# Patient Record
Sex: Male | Born: 1935 | Race: White | Hispanic: No
Health system: Midwestern US, Academic
[De-identification: ages and names within clinical notes are randomized; demographics above are authoritative.]

---

## 2017-04-25 ENCOUNTER — Encounter: Admit: 2017-04-25 | Discharge: 2017-04-25 | Payer: MEDICARE

## 2017-04-29 ENCOUNTER — Encounter: Admit: 2017-04-29 | Discharge: 2017-04-29 | Payer: MEDICARE

## 2017-07-01 ENCOUNTER — Encounter: Admit: 2017-07-01 | Discharge: 2017-07-01 | Payer: MEDICARE

## 2017-07-01 NOTE — Progress Notes
Records sent to [facility] for long term storage.  Box Number [id number]

## 2018-02-06 ENCOUNTER — Encounter: Admit: 2018-02-06 | Discharge: 2018-02-06 | Payer: MEDICARE

## 2018-11-18 ENCOUNTER — Encounter: Admit: 2018-11-18 | Discharge: 2018-11-18 | Payer: MEDICARE

## 2018-11-18 ENCOUNTER — Encounter: Admit: 2018-11-18 | Discharge: 2018-11-19 | Payer: MEDICARE

## 2018-11-18 DIAGNOSIS — S06359A Traumatic hemorrhage of left cerebrum with loss of consciousness of unspecified duration, initial encounter: Secondary | ICD-10-CM

## 2018-11-18 MED ORDER — CARVEDILOL 6.25 MG PO TAB
6.25 mg | Freq: Two times a day (BID) | ORAL | 0 refills | Status: DC
Start: 2018-11-18 — End: 2018-11-19
  Administered 2018-11-19 (×2): 6.25 mg via ORAL

## 2018-11-18 MED ORDER — LISINOPRIL 20 MG PO TAB
20 mg | Freq: Every day | ORAL | 0 refills | Status: DC
Start: 2018-11-18 — End: 2018-11-19
  Administered 2018-11-19: 14:00:00 20 mg via ORAL

## 2018-11-18 MED ORDER — ACETAMINOPHEN 325 MG PO TAB
650 mg | ORAL | 0 refills | Status: DC
Start: 2018-11-18 — End: 2018-11-19
  Administered 2018-11-19 (×2): 650 mg via ORAL

## 2018-11-18 MED ORDER — AMLODIPINE 5 MG PO TAB
5 mg | Freq: Every day | ORAL | 0 refills | Status: DC
Start: 2018-11-18 — End: 2018-11-19
  Administered 2018-11-19: 14:00:00 5 mg via ORAL

## 2018-11-18 MED ORDER — ACETAMINOPHEN 160 MG/5 ML PO SOLN
650 mg | NASOGASTRIC | 0 refills | Status: DC
Start: 2018-11-18 — End: 2018-11-19

## 2018-11-18 MED ORDER — LEVOTHYROXINE 50 MCG PO TAB
50 ug | Freq: Every day | ORAL | 0 refills | Status: DC
Start: 2018-11-18 — End: 2018-11-19
  Administered 2018-11-19: 14:00:00 50 ug via ORAL

## 2018-11-18 MED ORDER — SIMVASTATIN 20 MG PO TAB
20 mg | Freq: Every evening | ORAL | 0 refills | Status: DC
Start: 2018-11-18 — End: 2018-11-19
  Administered 2018-11-19: 03:00:00 20 mg via ORAL

## 2018-11-18 MED ORDER — ISOSORBIDE MONONITRATE 30 MG PO TB24
30 mg | Freq: Every day | ORAL | 0 refills | Status: DC
Start: 2018-11-18 — End: 2018-11-19
  Administered 2018-11-19: 14:00:00 30 mg via ORAL

## 2018-11-18 MED ORDER — ONDANSETRON HCL (PF) 4 MG/2 ML IJ SOLN
4-8 mg | INTRAVENOUS | 0 refills | Status: DC | PRN
Start: 2018-11-18 — End: 2018-11-19

## 2018-11-18 MED ORDER — MYCOPHENOLATE MOFETIL 500 MG PO TAB
500 mg | Freq: Two times a day (BID) | ORAL | 0 refills | Status: DC
Start: 2018-11-18 — End: 2018-11-19
  Administered 2018-11-19 (×2): 500 mg via ORAL

## 2018-11-18 MED ORDER — LEVETIRACETAM 500 MG PO TAB
500 mg | Freq: Two times a day (BID) | ORAL | 0 refills | Status: DC
Start: 2018-11-18 — End: 2018-11-19

## 2018-11-18 MED ORDER — MAGNESIUM SULFATE IN WATER 4 GRAM/50 ML (8 %) IV PGBK
4 g | Freq: Once | INTRAVENOUS | 0 refills | Status: CP
Start: 2018-11-18 — End: ?
  Administered 2018-11-19: 03:00:00 4 g via INTRAVENOUS

## 2018-11-18 MED ORDER — PANTOPRAZOLE 40 MG PO TBEC
40 mg | Freq: Every day | ORAL | 0 refills | Status: DC
Start: 2018-11-18 — End: 2018-11-19
  Administered 2018-11-19: 03:00:00 40 mg via ORAL

## 2018-11-18 MED ORDER — FENTANYL CITRATE (PF) 50 MCG/ML IJ SOLN
25 ug | INTRAVENOUS | 0 refills | Status: DC | PRN
Start: 2018-11-18 — End: 2018-11-19

## 2018-11-18 MED ORDER — DOCUSATE SODIUM 100 MG PO CAP
100 mg | Freq: Two times a day (BID) | ORAL | 0 refills | Status: DC
Start: 2018-11-18 — End: 2018-11-19
  Administered 2018-11-19: 03:00:00 100 mg via ORAL

## 2018-11-18 MED ORDER — POLYETHYLENE GLYCOL 3350 17 GRAM PO PWPK
1 | Freq: Every day | ORAL | 0 refills | Status: DC
Start: 2018-11-18 — End: 2018-11-19

## 2018-11-18 MED ORDER — OXYCODONE 5 MG PO TAB
5 mg | ORAL | 0 refills | Status: DC | PRN
Start: 2018-11-18 — End: 2018-11-19

## 2018-11-18 MED ORDER — LABETALOL 5 MG/ML IV SYRG
10 mg | INTRAVENOUS | 0 refills | Status: DC | PRN
Start: 2018-11-18 — End: 2018-11-19

## 2018-11-19 ENCOUNTER — Encounter: Admit: 2018-11-19 | Discharge: 2018-11-19 | Payer: MEDICARE

## 2018-11-19 ENCOUNTER — Inpatient Hospital Stay: Admit: 2018-11-19 | Discharge: 2018-11-19 | Payer: MEDICARE

## 2018-11-19 ENCOUNTER — Inpatient Hospital Stay
Admit: 2018-11-19 | Discharge: 2018-11-19 | Disposition: A | Discharge status: Home / Self Care | Payer: MEDICARE | Source: Other Acute Inpatient Hospital

## 2018-11-19 DIAGNOSIS — G8911 Acute pain due to trauma: ICD-10-CM

## 2018-11-19 DIAGNOSIS — S06359D Traumatic hemorrhage of left cerebrum with loss of consciousness of unspecified duration, subsequent encounter: Principal | ICD-10-CM

## 2018-11-19 DIAGNOSIS — Z7982 Long term (current) use of aspirin: ICD-10-CM

## 2018-11-19 DIAGNOSIS — D649 Anemia, unspecified: ICD-10-CM

## 2018-11-19 DIAGNOSIS — I4891 Unspecified atrial fibrillation: ICD-10-CM

## 2018-11-19 DIAGNOSIS — R402413 Glasgow coma scale score 13-15, at hospital admission: ICD-10-CM

## 2018-11-19 DIAGNOSIS — E785 Hyperlipidemia, unspecified: ICD-10-CM

## 2018-11-19 DIAGNOSIS — I120 Hypertensive chronic kidney disease with stage 5 chronic kidney disease or end stage renal disease: ICD-10-CM

## 2018-11-19 DIAGNOSIS — Z940 Kidney transplant status: ICD-10-CM

## 2018-11-19 DIAGNOSIS — M19012 Primary osteoarthritis, left shoulder: ICD-10-CM

## 2018-11-19 DIAGNOSIS — M25562 Pain in left knee: ICD-10-CM

## 2018-11-19 DIAGNOSIS — Z66 Do not resuscitate: ICD-10-CM

## 2018-11-19 DIAGNOSIS — S066X9A Traumatic subarachnoid hemorrhage with loss of consciousness of unspecified duration, initial encounter: Principal | ICD-10-CM

## 2018-11-19 DIAGNOSIS — I2510 Atherosclerotic heart disease of native coronary artery without angina pectoris: ICD-10-CM

## 2018-11-19 DIAGNOSIS — Z7902 Long term (current) use of antithrombotics/antiplatelets: ICD-10-CM

## 2018-11-19 DIAGNOSIS — S06359A Traumatic hemorrhage of left cerebrum with loss of consciousness of unspecified duration, initial encounter: ICD-10-CM

## 2018-11-19 DIAGNOSIS — E039 Hypothyroidism, unspecified: ICD-10-CM

## 2018-11-19 LAB — BASIC METABOLIC PANEL
Lab: 1.1 mg/dL (ref 0.4–1.24)
Lab: 108 mg/dL — ABNORMAL HIGH (ref 70–100)
Lab: 18 mg/dL (ref 7–25)
Lab: 23 MMOL/L (ref 21–30)
Lab: 57 mL/min — ABNORMAL LOW (ref >60)
Lab: >60 mL/min (ref >60)
Lab: 140 MMOL/L (ref 137–147)
Lab: 24 MMOL/L (ref 21–30)
Lab: 10 (ref 3–12)

## 2018-11-19 LAB — CBC
Lab: 9.7 10*3/uL (ref 4.5–11.0)
Lab: 7 10*3/uL (ref >60)

## 2018-11-19 LAB — TACROLIMUS IMMUNOASSAY (FK506): Lab: 8.2 ng/mL (ref 2–15)

## 2018-11-19 LAB — PHOSPHORUS: Lab: 3.4 mg/dL (ref 2.0–4.5)

## 2018-11-19 MED ORDER — TACROLIMUS 1 MG PO CAP
1 mg | Freq: Two times a day (BID) | ORAL | 0 refills | Status: DC
Start: 2018-11-19 — End: 2018-11-19

## 2018-11-19 MED ORDER — TACROLIMUS 1 MG PO CAP
1 mg | Freq: Two times a day (BID) | ORAL | 0 refills | Status: DC
Start: 2018-11-19 — End: 2018-11-19
  Administered 2018-11-19: 19:00:00 1 mg via ORAL

## 2018-11-19 NOTE — Progress Notes
OCCUPATIONAL THERAPY  ASSESSMENT/DISCHARGE NOTE      Name: Brad Short        MRN: [id number]          DOB: [birth year]          Age: 85 y.o.  Admission Date: 11/18/2018             LOS: 1 day    Mobility  Patient Turn/Position: Chair  Progressive Mobility Level: Walk laps  Distance Walked (feet): 300 ft  Level of Assistance: Assist X1  Assistive Device: Walker  Time Tolerated: 11-30 minutes  Activity Limited By: No limitations    Subjective  Pertinent Dx per Physician: 85 y.o. male with fall yesterday, LOC, neurologically intact on exam, with CT head demonstrating a 1.3 cm left frontal IPH  Precautions: Falls  Pain / Complaints: Patient agrees to participate in therapy  Pain Location: Left;Shoulder  Pain Level Current: (mild)    Objective  Psychosocial Status: Willing and Cooperative to Participate  Persons Present: Physical Therapist    Home Living  Type of Home: House  Home Layout: One Level  Financial risk analyst / Tub: Pension scheme manager: Standard  Bathroom Equipment: Engineer, materials in Air traffic controller;Shower Chair;Grab Bars Around [employer] Equipment: Environmental consultant    Prior Function  Level Of Independence: Independent with ADLs and functional transfers;Independent with homemaking w/ ambulation  Lives With: Alone  Other Function Comments: No other falls reported. Son able to provide intermittent assist.    Vision  Current Vision: Wears Glasses All of the Time  Comment: Pt reports needing to get new glasses. No vision changes since fall.    ADL's  Where Assessed: Edge of Bed;In [place];Chair  LE Dressing Assist: Stand By Assist  LE Dressing Deficits: Thread RLE Into Underwear;Thread LLE Into Underwear  Toileting Assist: Stand By Assist  Toileting Deficits: No Assist Needed;Perineal Hygiene;Clothing Management Down;Clothing Management Up  Functional Transfer Assist: Minimal Assist  Functional Transfer Deficits: Supervision/Safety  Comment: supine>sit stand by assist; sit>stand contact guard assist;

## 2018-11-19 NOTE — Progress Notes
-  Disp - ICU; pending PT/OT/SW recs possible for pt to discharge home today.      Discussed with Dr. Mayford Knife who directed plan of care  _______________________________________________________________  Subjective:  NAEO. Pt complaining of minimal L shoulder and L knee pain. Denies head pain. Reports he would like to discharge home.     Objective:  BP: (110-159)/(64-99)   Temp:  [36.4 ???C (97.5 ???F)-37 ???C (98.6 ???F)]   Pulse:  [69-113]   Respirations:  [16 PER MINUTE-26 PER MINUTE]   SpO2:  [92 %-97 %]     Lab Results   Component Value Date/Time    NA 140 11/19/2018 05:31 AM    K 4.1 11/19/2018 05:31 AM    CL 106 11/19/2018 05:31 AM    CO2 24 11/19/2018 05:31 AM    BUN 17 11/19/2018 05:31 AM    CR 1.16 11/19/2018 05:31 AM    MG 2.4 11/19/2018 05:31 AM    PO4 3.4 11/19/2018 05:31 AM      Lab Results   Component Value Date/Time    HGB 10.5 (L) 11/19/2018 04:05 AM    HCT 31.4 (L) 11/19/2018 04:05 AM    WBC 7.0 11/19/2018 04:05 AM    PLTCT 169 11/19/2018 04:05 AM    INR 1.0 05/26/2008 04:00 PM     Lab Results   Component Value Date/Time    GLUPOC 93 05/27/2008 04:21 PM    GLUPOC 108 (H) 05/27/2008 11:00 AM    GLUPOC 99 05/27/2008 06:34 AM      Physical Exam   Constitutional: He is oriented to person, place, and time. He appears well-developed and well-nourished.   HENT:   Head: Normocephalic and atraumatic.   Mouth/Throat: Oropharynx is clear and moist.   Eyes: Pupils are equal, round, and reactive to light. EOM are normal.   Neck: Normal range of motion.   Cardiovascular: Normal rate and intact distal pulses.   Pulmonary/Chest: Effort normal and breath sounds normal. No respiratory distress.   Abdominal: Soft.   Musculoskeletal: Normal range of motion.      Comments: Superficial abrasion to L knee   Neurological: He is alert and oriented to person, place, and time.   Skin: Skin is warm and dry.   Nursing note and vitals reviewed.    Rosalin Hawking, DO  Emergency Medicine, PGY1  [facility]

## 2018-11-19 NOTE — Progress Notes
Patient discharged at 1500 via wheelchair to son's private vehicle. All belongings sent with patient. Discharge paperwork reviewed with patient and any questions answered. Vital signs stable, IV removed.

## 2018-11-19 NOTE — Progress Notes
PHYSICAL THERAPY  ASSESSMENT      Name: Brad Short        MRN: [id number]          DOB: [birth year]          Age: 85 y.o.  Admission Date: 11/18/2018             LOS: 1 day      Mobility  Patient Turn/Position: Chair  Progressive Mobility Level: Walk laps  Distance Walked (feet): 300 ft  Level of Assistance: Assist X1  Assistive Device: Walker  Time Tolerated: 11-30 minutes  Activity Limited By: No limitations    Subjective  Significant hospital events: 85 y.o. male   with HTN, ESRD s/p 2007 renal tranpslant, atrial fibrillation who presents as transfer after fall from standing with CT evidence of intraparenchymal hemorrhage.  Mental / Cognitive Status: Alert;Oriented;Cooperative  Persons Present: Occupational Therapist  Pain: Patient complains of pain;During activity  Pain Location: Left;Shoulder  Pain Interventions: Patient agrees to participate in therapy with modifications to session  Ambulation Assist: Independent Mobility in Community with Device  Patient Owned Equipment: Nurse, adult  Home Situation: Lives Alone  Type of Home: House  Entry Stairs: No Stairs  In-Home Stairs: No Stairs  Comments: son is available to check in on him    ROM  UE ROM WFL: Yes  LE ROM WFL: Yes    Strength  Overall Strength: WFL    Posture/Neurological  Overall Sensation/Proprioception: No Deficits Noted  Coordination: No Deficits Noted    Bed Mobility/Transfer  Bed Mobility: Supine to Sit: Modified Independent  Transfer Type: Sit to/from Stand  Transfer: Assistance Level: From;Bed;To;Toilet;Standby Assist  Transfer: Assistive Device: Nurse, adult  Transfers: Type Of Assistance: For Safety Considerations  End Of Activity Status: Up in Chair;Nursing Notified;Instructed Patient to Use Call Light    Balance  Sitting Balance: Static Sitting Balance;Dynamic Sitting Balance;No UE Support;Independent  Standing Balance: Static Standing Balance;Dynamic Standing Balance;2 UE support;Independent    Gait  Gait Distance: 300 feet

## 2018-11-19 NOTE — Case Management (ED)
[place] [id number]  Phone: [phone] Fax: [phone]    ? Durable Medical Equipment   Durable Medical Equipment at home: Shower Chair, Nurse, adult  ? Home Health  Receiving home health: No  ? Hemodialysis or Peritoneal Dialysis  Undergoing hemodialysis or peritoneal dialysis: No  ? Tube/Enteral Feeds  Receive tube/enteral feeds: No  ? Infusion  Receive infusions: No  ? Private Duty  Private duty help used: No  ? Home and Community Based Services  Home and community based services: No  ? [employer]: N/A  ? Hospice  Hospice: No  ? Outpatient Therapy  PT: No  OT: No  SLP: No  ? Skilled Nursing Facility/Nursing Home  SNF: In the past  Name of Facility: [facility] in [place]  Would patient return for future services?: Yes  NH: No  ? Inpatient Rehab  IPR: No  ? Long-Term Acute Care Hospital  LTACH: No  ? Acute Hospital Stay  Acute Hospital Stay: In the past  Was patient's stay within the last 30 days?: No    Trauma Specific Frailty Index (TSFI) score  Comobidities  Cancer history           Yes      1       No     0     Coronary heart disease   Myocardial infarction   1     Coronary Artery bypass grafting  0.75      Percutaneous coronary intervention 0.5       Medication    0.25   No medication    0  Dementia   Severe     1   Moderate    0.5   Mild     0.25   None     0  Daily Activities  Help with Grooming   Yes     1   No     0  Help with Managing Money    Yes     1   No     0  Help doing household work   Yes     1   No     0  Help toileting      Yes     1   No     0  Help walking   Wheelchair    1   Walker     0.75   Cane     0.5   None     0  Health attitude  Feel less useful    Most time    1   Sometimes    0.5   Never     0  Feel sad   Most time    1   Sometimes    0.5   Never     0  Feel effort to do everything   Most time    1   Sometimes    0.5   Never     0  Falls   Most time    1   Sometimes    0.5   Never     0  Feel lonely   Most time    1   Sometimes    0.5   Never     0

## 2018-11-19 NOTE — Progress Notes
Chaplain Note:    Admit Date: 11/18/2018         Chaplain met with patient introducing spiritual care and offering supportive presence.  Pt was in good spirits noting that he gets to go home.  Pt was waiting for his son to come get him.  Pt shared that he was [employer] and Alwyn Pea knew he was in the hospital [facility]).    Pt talked about his dog Nori Riis and how he can't wait to see her and that everyone in town knows her.  Pt appeared to be optimistic.  I shared a prayer blessing for a good transition back home and that he does well.  Pt had not specific spiritual needs at this time.      The spiritual care team is available as needed, 24/7, through the campus switchboard [phone]).  For immediate response, please page [phone].  For a response within 24 hours, please submit an order in [facility] for a chaplain consult.       Date/Time:                      User:                                    Pager: [phone]  11/19/2018 4:29 PM Antony Salmon, M.Div, [facility]      PCU 4 PCU

## 2018-11-23 LAB — PHOSPHORUS

## 2018-11-23 LAB — BASIC METABOLIC PANEL

## 2018-12-04 ENCOUNTER — Encounter: Admit: 2018-12-04 | Discharge: 2018-12-04 | Payer: MEDICARE

## 2018-12-08 ENCOUNTER — Encounter: Admit: 2018-12-08 | Discharge: 2018-12-09 | Payer: MEDICARE

## 2018-12-08 ENCOUNTER — Encounter: Admit: 2018-12-08 | Discharge: 2018-12-08 | Payer: MEDICARE

## 2018-12-09 ENCOUNTER — Encounter: Admit: 2018-12-09 | Discharge: 2018-12-09 | Payer: MEDICARE

## 2018-12-11 ENCOUNTER — Ambulatory Visit: Admit: 2018-12-11 | Discharge: 2018-12-12 | Payer: MEDICARE

## 2018-12-11 ENCOUNTER — Encounter: Admit: 2018-12-11 | Discharge: 2018-12-11 | Payer: MEDICARE

## 2018-12-11 DIAGNOSIS — R079 Chest pain, unspecified: ICD-10-CM

## 2018-12-11 DIAGNOSIS — R0600 Dyspnea, unspecified: ICD-10-CM

## 2018-12-11 DIAGNOSIS — Z7901 Long term (current) use of anticoagulants: ICD-10-CM

## 2018-12-11 DIAGNOSIS — E785 Hyperlipidemia, unspecified: ICD-10-CM

## 2018-12-11 DIAGNOSIS — I10 Essential (primary) hypertension: ICD-10-CM

## 2018-12-11 DIAGNOSIS — I2510 Atherosclerotic heart disease of native coronary artery without angina pectoris: Principal | ICD-10-CM

## 2018-12-11 DIAGNOSIS — I4891 Unspecified atrial fibrillation: ICD-10-CM

## 2018-12-11 NOTE — Progress Notes
Commonly known as:  NORVASC     carvediloL 6.25 mg tablet  Commonly known as:  COREG     CELLCEPT 500 mg tablet  Generic drug:  mycophenolate mofetil     FISH OIL 1,000 mg Cap  Generic drug:  omega-3 fatty acids-vitamin E     isosorbide mononitrate SR 30 mg tablet  Commonly known as:  IMDUR     levothyroxine 50 mcg tablet  Commonly known as:  SYNTHROID     lisinopriL 20 mg tablet  Commonly known as:  ZESTRIL     magnesium oxide 400 mg (241.3 mg magnesium) tablet  Commonly known as:  MAG-OX     pantoprazole DR 40 mg tablet  Commonly known as:  PROTONIX     PROGRAF 1 mg capsule  Generic drug:  tacrolimus     simvastatin 20 mg tablet  Commonly known as:  ZOCOR            PHYSICAL EXAM:  There were no vitals filed for this visit.    Neuro: Awake and alert.  Speech and language are normal.  Facial movement is symmetric.  Tongue protrudes in the midline.  He moves all extremities well.    ASSESSMENT/PLAN:   1. Traumatic left-sided intracerebral hematoma with loss of consciousness, initial encounter (HCC)    2. Antiplatelet or antithrombotic long-term use    3. Fall from standing, initial encounter      He has no residual postconcussive symptoms or any symptoms referrable to his small left frontal traumatic intracranial hemorrhage.  His CT demonstrates no evidence of delayed subdural hygroma/hematoma formation and his intracerebral hemorrhages resorbed.  It is acceptable for him to resume his dual antiplatelet therapy today.  Given that his last coronary stent was placed some 13 years ago, I will leave it to his family physician and cardiologist to determine when or if discontinuation of Plavix might be considered.  No neurosurgical follow-up for further cranial imaging is needed.  He knows he can call the office with any questions or concerns.    Maryruth Eve, MD [facility]  Assistant Professor - Neurosurgery  [facility]  O: [phone]  F: [phone]  P: [phone]    Zoom Consent:

## 2018-12-12 DIAGNOSIS — Z7902 Long term (current) use of antithrombotics/antiplatelets: Secondary | ICD-10-CM

## 2018-12-12 DIAGNOSIS — S06359A Traumatic hemorrhage of left cerebrum with loss of consciousness of unspecified duration, initial encounter: Principal | ICD-10-CM

## 2020-05-25 IMAGING — CT BRAIN WO(Adult)
1 of 2 series · 3 of 6 positions shown, 4 images · non-contrast
Comparison: none

[Series 2: brain ax 5.00 hr40 s3 · axial · 0.37mm/px · z∈[-512,-454]mm · 3 of 3 slices shown, 4 images]
[im 1/3  brain]
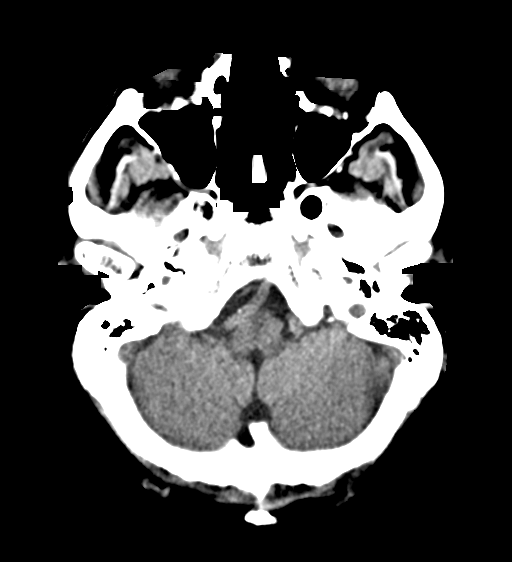
[im 1/3  bone]
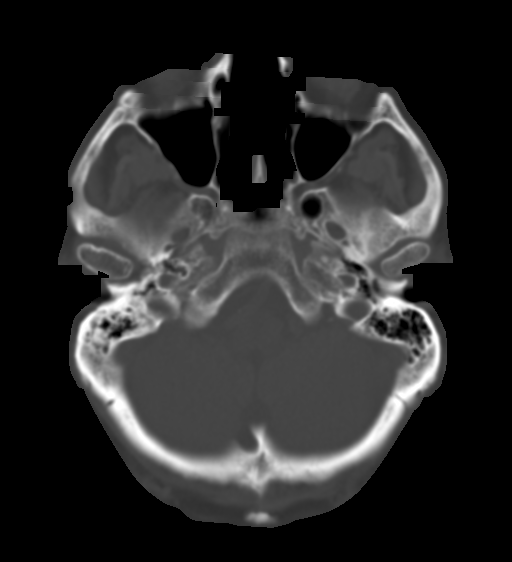
[im 2/3  brain]
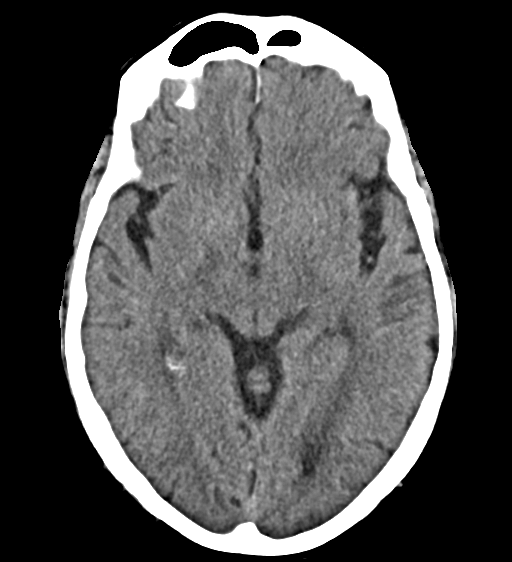
[im 3/3  brain]
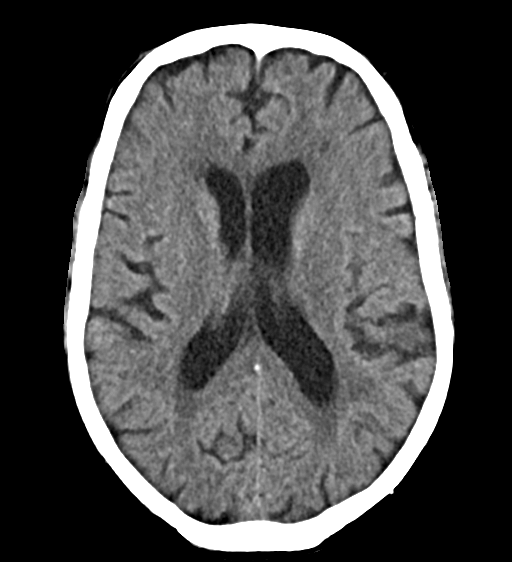

[3 of 6 positions shown; findings below may reference images not displayed]

EXAM
Head CT without contrast

INDICATION
Traumatic left side intracerebral hematoma
1 ct/ 0 nm. f/u left side intacerebral hematoma. ME

FINDINGS
The prior study was reviewed from 11/18/2018.
CT of the head was performed without contrast.
All CT scans at this facility use dose modulation, iterative reconstruction, and/or weight based
dosing when appropriate to reduce radiation dose to as low as reasonably achievable. In the past 12
months, there has been 1 CT scan and 0 nuclear medicine myocardial perfusion scans.
There has been resolution of the intraparenchymal hematoma within the left frontal lobe since the
prior study. There is a small area residual hypodensity at the site of the previous hematoma.
Small hemorrhagic contusion and subarachnoid hemorrhage on the right have also resolved since the
prior study.
There is no new area of hemorrhage.
There are scattered areas of hypodensity within the periventricular white matter both both cerebral
hemispheres which appear unchanged.

IMPRESSION
There has been evolution and resolution of intraparenchymal hematoma involving the left frontal
lobe with a small area residual encephalomalacia and hypodensity in the white matter of the left
frontal lobe. There has been resolution of previous hemorrhagic contusion and subarachnoid
hemorrhage involving the right frontal lobe. No new areas of hemorrhage are identified. There is
chronic small vessel white matter disease and mild cerebral atrophy.

Tech Notes:

1 ct/ 0 nm. f/u left side intacerebral hematoma. ME

## 2022-04-18 ENCOUNTER — Encounter: Admit: 2022-04-18 | Discharge: 2022-04-18 | Payer: MEDICARE

## 2022-04-18 NOTE — Progress Notes
Pt. Lives in [place], diagnosed with COVID a couple days ago, sats in 43s, SOA.  Pt.'s hgb. 6.6, no report of bleeding.      CHF volume overload, they were going to admit there but hospitalist was concerned that pt. Has had a kidney trx.

## 2022-04-19 ENCOUNTER — Encounter: Admit: 2022-04-19 | Discharge: 2022-04-19 | Payer: MEDICARE

## 2022-04-19 ENCOUNTER — Ambulatory Visit: Admit: 2022-04-19 | Discharge: 2022-04-19 | Payer: MEDICARE

## 2022-04-19 DIAGNOSIS — E876 Hypokalemia: Secondary | ICD-10-CM

## 2022-04-19 DIAGNOSIS — U071 COVID-19: Secondary | ICD-10-CM

## 2022-09-30 ENCOUNTER — Encounter: Admit: 2022-09-30 | Discharge: 2022-09-30 | Payer: MEDICARE

## 2022-09-30 ENCOUNTER — Ambulatory Visit: Admit: 2022-09-30 | Discharge: 2022-09-30 | Payer: MEDICARE

## 2022-09-30 DIAGNOSIS — R609 Edema, unspecified: Secondary | ICD-10-CM

## 2022-10-25 DEATH — deceased

## 2023-07-21 ENCOUNTER — Encounter: Admit: 2023-07-21 | Discharge: 2023-07-21 | Payer: MEDICARE
